# Patient Record
Sex: Female | Born: 1950 | Race: White | Hispanic: No | Marital: Married | Smoking: Never smoker
Health system: Southern US, Community
[De-identification: ages and names within clinical notes are randomized; demographics above are authoritative.]

## PROBLEM LIST (undated history)

## (undated) DIAGNOSIS — E7800 Pure hypercholesterolemia, unspecified: Secondary | ICD-10-CM

## (undated) DIAGNOSIS — I10 Essential (primary) hypertension: Secondary | ICD-10-CM

---

## 2017-02-23 ENCOUNTER — Encounter: Payer: Self-pay | Admitting: *Deleted

## 2017-02-23 ENCOUNTER — Ambulatory Visit: Payer: Medicare Other

## 2017-02-23 ENCOUNTER — Ambulatory Visit
Admission: EM | Admit: 2017-02-23 | Discharge: 2017-02-23 | Disposition: A | Discharge status: Home / Self Care | Payer: Medicare Other | Attending: Family Medicine | Admitting: Family Medicine

## 2017-02-23 DIAGNOSIS — E7800 Pure hypercholesterolemia, unspecified: Secondary | ICD-10-CM | POA: Diagnosis not present

## 2017-02-23 DIAGNOSIS — J189 Pneumonia, unspecified organism: Secondary | ICD-10-CM | POA: Diagnosis not present

## 2017-02-23 DIAGNOSIS — Z79899 Other long term (current) drug therapy: Secondary | ICD-10-CM | POA: Insufficient documentation

## 2017-02-23 DIAGNOSIS — R0981 Nasal congestion: Secondary | ICD-10-CM | POA: Insufficient documentation

## 2017-02-23 DIAGNOSIS — I10 Essential (primary) hypertension: Secondary | ICD-10-CM | POA: Diagnosis not present

## 2017-02-23 DIAGNOSIS — J181 Lobar pneumonia, unspecified organism: Secondary | ICD-10-CM | POA: Diagnosis not present

## 2017-02-23 HISTORY — DX: Pure hypercholesterolemia, unspecified: E78.00

## 2017-02-23 HISTORY — DX: Essential (primary) hypertension: I10

## 2017-02-23 MED ORDER — BENZONATATE 100 MG PO CAPS: 100.0000 mg | ORAL_CAPSULE | Freq: Three times a day (TID) | ORAL | 0 refills | Status: AC | PRN

## 2017-02-23 MED ORDER — AZITHROMYCIN 250 MG PO TABS: ORAL_TABLET | ORAL | 0 refills | Status: AC

## 2017-02-23 NOTE — Discharge Instructions (Signed)
Antibiotic as prescribed.  Flonase for the nasal congestion.  Tessalon for cough.  Take care  Dr. Adriana Simasook

## 2017-02-23 NOTE — ED Provider Notes (Signed)
[facility]    CSN: [id number] Arrival date & time: 02/23/17  1301  History   Chief Complaint Chief Complaint  Patient presents with  . Nasal Congestion   HPI  66 year old female presents with respiratory symptoms.  Patient reports that she was recently back home in [place] and stayed in a substandard hotel.  She states that she subsequently developed sinus pressure and congestion and associated cough.  Also reports associated hoarseness.  Cough is clear.  She reports associated chest discomfort with a cough.  Her symptoms are moderate in severity.  She was seen at a local urgent care and was given prednisone and also treated for UTI.  She states that she has had no improvement.  She continues to be bothered by her symptoms.  No known exacerbating factors.  No fever.  No other associated symptoms.  No other complaints at this time.  Past Medical History:  Diagnosis Date  . Hypercholesteremia   . Hypertension    History reviewed. No pertinent surgical history.  Home Medications    Prior to Admission medications   Medication Sig Start Date End Date Taking? Authorizing Provider  rosuvastatin (CRESTOR) 10 MG tablet Take 10 mg by mouth daily.   Yes [provider]  valsartan (DIOVAN) 80 MG tablet Take 80 mg by mouth daily.   Yes [provider]  azithromycin (ZITHROMAX) 250 MG tablet 2 tablets on Day 1, then 1 tablet daily on Days 2-5. 02/23/17   Tommie Sams, DO  benzonatate (TESSALON) 100 MG capsule Take 1 capsule (100 mg total) by mouth 3 (three) times daily as needed for cough. 02/23/17   Tommie Sams, DO   Family History Father - Heart disease.  Social History Social History  Substance Use Topics  . Smoking status: Never Smoker  . Smokeless tobacco: Never Used  . Alcohol use Yes   Allergies   Patient has no known allergies.   Review of Systems Review of Systems  Constitutional: Negative.   HENT: Positive for congestion, sinus pain,  sinus pressure and voice change.   Respiratory: Positive for cough and chest tightness.   All other systems reviewed and are negative.   Physical Exam Triage Vital Signs ED Triage Vitals  Enc Vitals Group     BP 02/23/17 1320 (!) 143/92     Pulse Rate 02/23/17 1320 80     Resp 02/23/17 1320 16     Temp 02/23/17 1320 98.1 F (36.7 C)     Temp Source 02/23/17 1320 Oral     SpO2 02/23/17 1320 99 %     Weight 02/23/17 1321 150 lb (68 kg)     Height --      Head Circumference --      Peak Flow --      Pain Score 02/23/17 1325 4     Pain Loc --      Pain Edu? --      Excl. in GC? --    No data found.   Updated Vital Signs BP (!) 143/92 (BP Location: Left Arm)   Pulse 80   Temp 98.1 F (36.7 C) (Oral)   Resp 16   Wt 150 lb (68 kg)   SpO2 99%   Physical Exam  Constitutional: She is oriented to person, place, and time. She appears well-developed and well-nourished. No distress.  HENT:  Head: Normocephalic and atraumatic.  Mouth/Throat: Oropharynx is clear and moist. No oropharyngeal exudate.  No sinus tenderness palpation.  Eyes:  Conjunctivae are normal. No scleral icterus.  Neck: Neck supple.  Cardiovascular: Normal rate and regular rhythm.   No murmur heard. Pulmonary/Chest: Effort normal. No respiratory distress. She has no wheezes. She has no rales.  Abdominal: Soft. She exhibits no distension. There is no tenderness. There is no rebound and no guarding.  Lymphadenopathy:    She has no cervical adenopathy.  Neurological: She is alert and oriented to person, place, and time.  Head tremor noted.  Skin: Skin is warm. No rash noted.  Psychiatric: She has a normal mood and affect. Her behavior is normal.  Vitals reviewed.  UC Treatments / Results  Labs (all labs ordered are listed, but only abnormal results are displayed) Labs Reviewed - No data to display  EKG  EKG Interpretation None       Radiology Dg Chest 2 View  Result Date: 02/23/2017 CLINICAL  DATA:  Cough and congestion. EXAM: CHEST  2 VIEW COMPARISON:  No recent prior . FINDINGS: Mediastinum and hilar structures normal. Mild left base subsegmental atelectasis. Mild infiltrate cannot be excluded. No pleural effusion or pneumothorax. Thoracic spine scoliosis and degenerative change . IMPRESSION: Mild left base subsegmental atelectasis. Mild infiltrate cannot be excluded . Electronically Signed   By: Maisie Fushomas  Register   On: 02/23/2017 14:12    Procedures Procedures (including critical care time)  Medications Ordered in UC Medications - No data to display   Initial Impression / Assessment and Plan / UC Course  I have reviewed the triage vital signs and the nursing notes.  Pertinent labs & imaging results that were available during my care of the patient were reviewed by me and considered in my medical decision making (see chart for details).     66 year old female presents with respiratory symptoms.  Ongoing.  Chest x-ray obtained and revealed atelectasis versus infiltrate.  I am concerned about early pneumonia.  As a result, I am diagnosing her with community acquired pneumonia and treating with azithromycin and Tessalon.  Final Clinical Impressions(s) / UC Diagnoses   Final diagnoses:  Community acquired pneumonia of left lower lobe of lung (HCC)   New Prescriptions New Prescriptions   AZITHROMYCIN (ZITHROMAX) 250 MG TABLET    2 tablets on Day 1, then 1 tablet daily on Days 2-5.   BENZONATATE (TESSALON) 100 MG CAPSULE    Take 1 capsule (100 mg total) by mouth 3 (three) times daily as needed for cough.   Controlled Substance Prescriptions [place] Controlled Substance Registry consulted? Not Applicable   Tommie SamsCook, Hjalmer Iovino G, DO 02/23/17 1428

## 2017-02-23 NOTE — ED Triage Notes (Signed)
Patient started having symptoms of nasal congestion, cough chest congestion, and right ear pain 1 week ago and was treated on the same day for UTI and cough. Patient reports UTI symptoms have resolved, but the chest congestion and cough symptoms persist.

## 2018-10-06 IMAGING — CR DG CHEST 2V
2 series · 2 of 2 positions shown · non-contrast
Comparison: No recent prior .

CLINICAL DATA: Cough and congestion.

EXAM:
CHEST  2 VIEW

[chest pa]
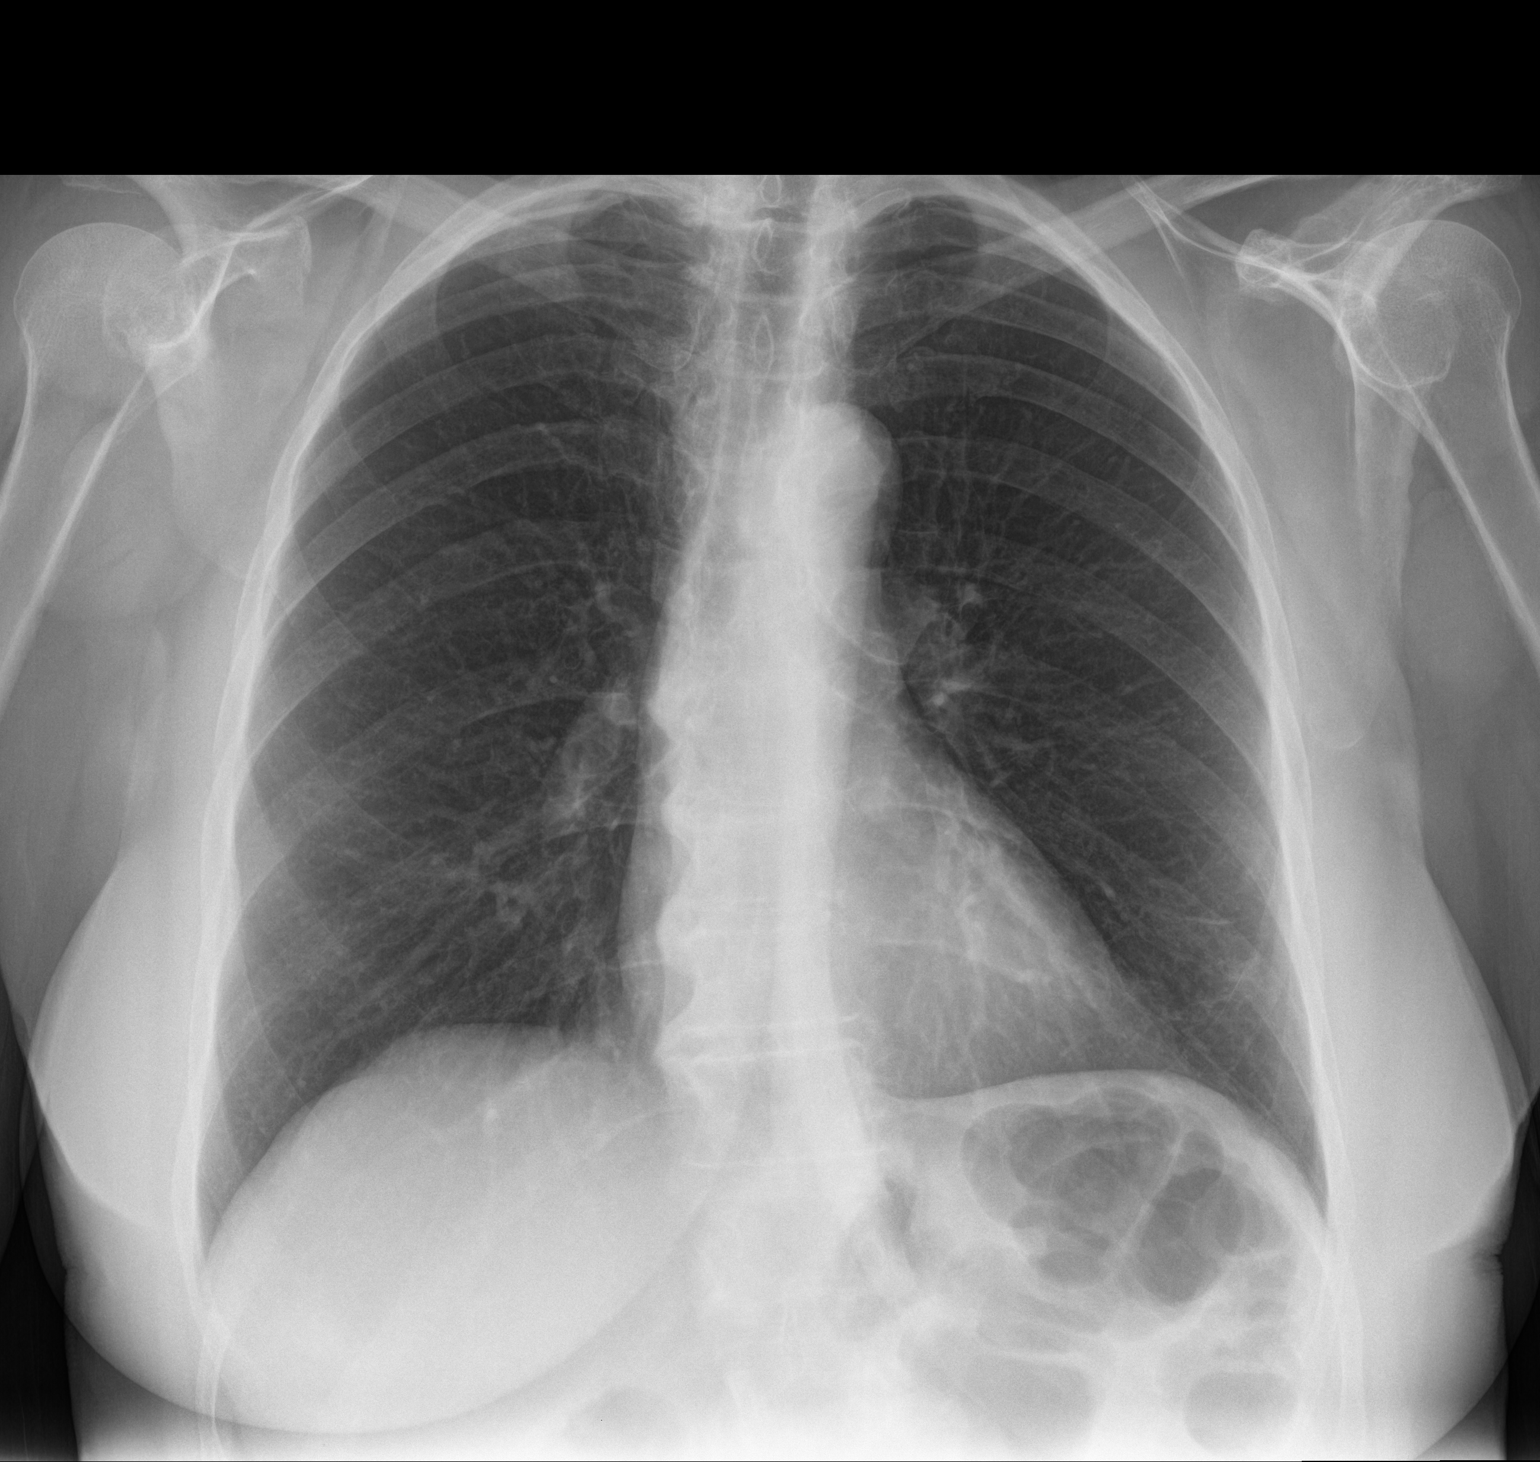

[chest lat]
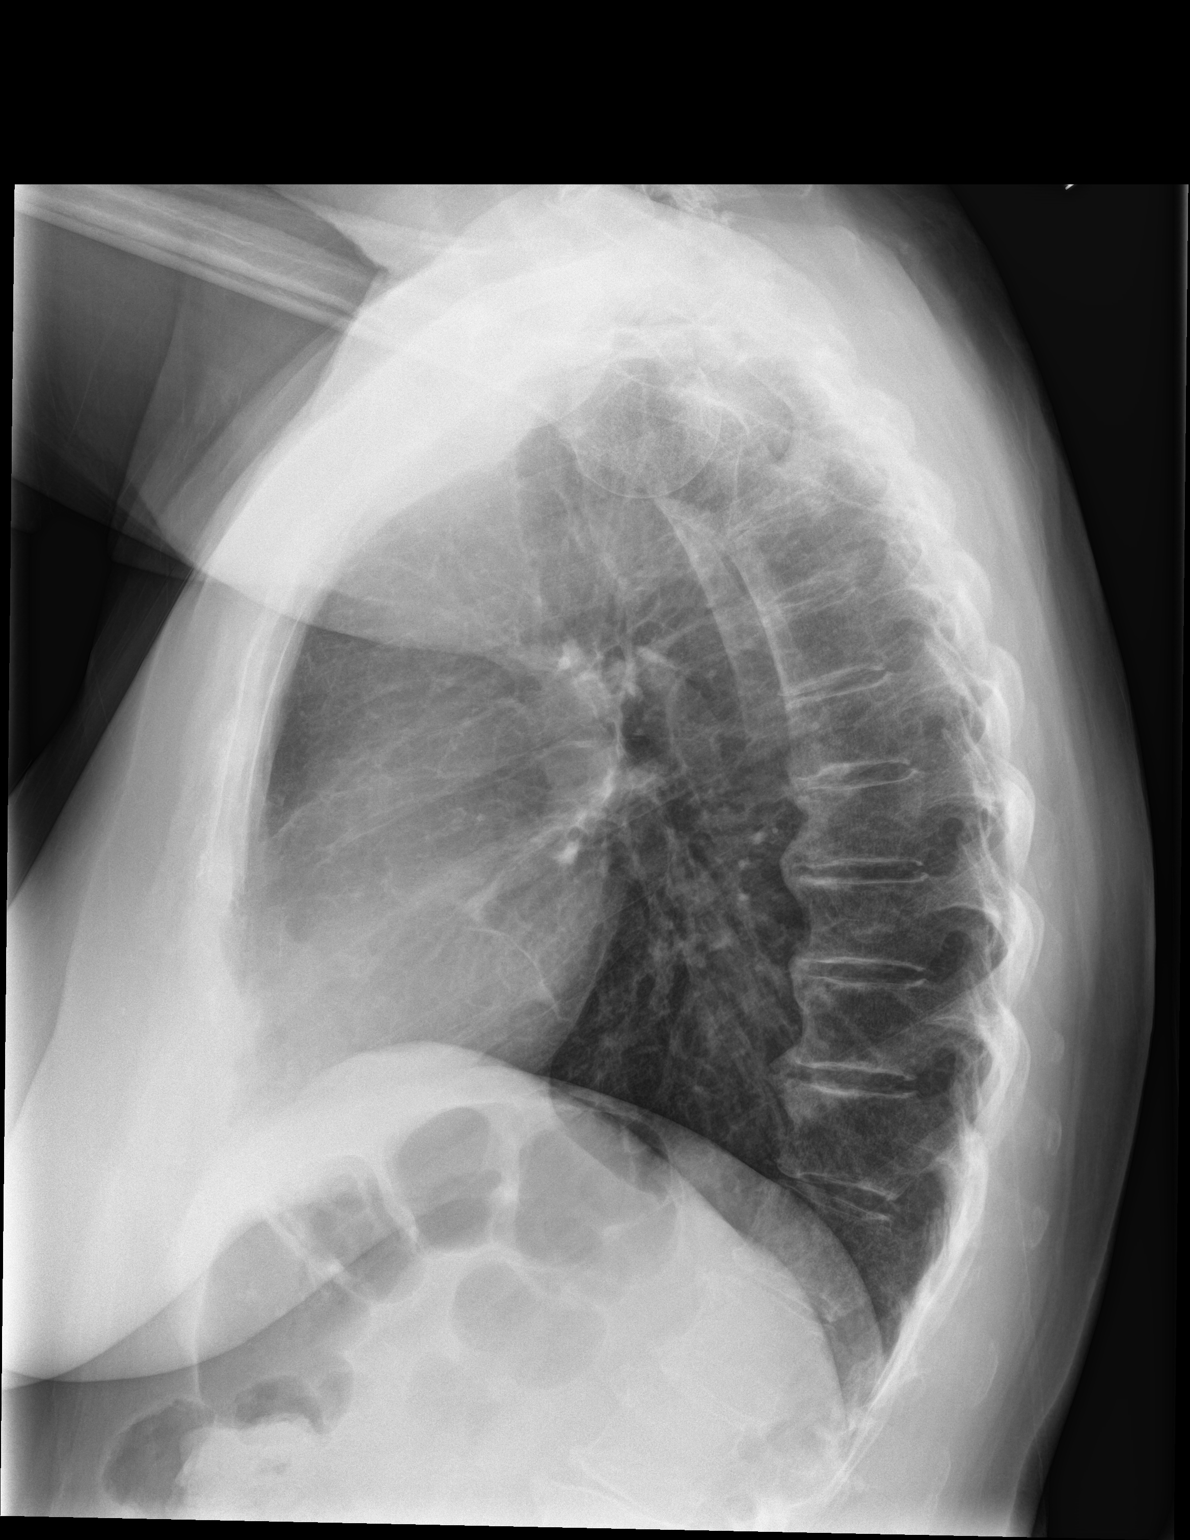

[2 of 2 positions shown; findings below may reference images not displayed]

FINDINGS: Mediastinum and hilar structures normal. Mild left base subsegmental
atelectasis. Mild infiltrate cannot be excluded. No pleural effusion
or pneumothorax. Thoracic spine scoliosis and degenerative change .
IMPRESSION: Mild left base subsegmental atelectasis. Mild infiltrate cannot be
excluded .
